# Patient Record
Sex: Female | Born: 1955
Health system: Southern US, Community
[De-identification: ages and names within clinical notes are randomized; demographics above are authoritative.]

---

## 2015-10-24 DIAGNOSIS — Z8249 Family history of ischemic heart disease and other diseases of the circulatory system: Secondary | ICD-10-CM | POA: Diagnosis not present

## 2015-10-24 DIAGNOSIS — Z0000 Encounter for general adult medical examination without abnormal findings: Secondary | ICD-10-CM | POA: Diagnosis not present

## 2015-10-24 DIAGNOSIS — F458 Other somatoform disorders: Secondary | ICD-10-CM | POA: Diagnosis not present

## 2015-10-24 DIAGNOSIS — G44009 Cluster headache syndrome, unspecified, not intractable: Secondary | ICD-10-CM | POA: Diagnosis not present

## 2015-10-24 DIAGNOSIS — R739 Hyperglycemia, unspecified: Secondary | ICD-10-CM | POA: Diagnosis not present

## 2015-10-24 DIAGNOSIS — E781 Pure hyperglyceridemia: Secondary | ICD-10-CM | POA: Diagnosis not present

## 2015-10-25 DIAGNOSIS — R739 Hyperglycemia, unspecified: Secondary | ICD-10-CM | POA: Diagnosis not present

## 2015-10-25 DIAGNOSIS — E781 Pure hyperglyceridemia: Secondary | ICD-10-CM | POA: Diagnosis not present

## 2015-11-22 DIAGNOSIS — Z6834 Body mass index (BMI) 34.0-34.9, adult: Secondary | ICD-10-CM | POA: Diagnosis not present

## 2015-11-22 DIAGNOSIS — E669 Obesity, unspecified: Secondary | ICD-10-CM | POA: Diagnosis not present

## 2015-11-22 DIAGNOSIS — Z01419 Encounter for gynecological examination (general) (routine) without abnormal findings: Secondary | ICD-10-CM | POA: Diagnosis not present

## 2015-11-22 DIAGNOSIS — Z8249 Family history of ischemic heart disease and other diseases of the circulatory system: Secondary | ICD-10-CM | POA: Diagnosis not present

## 2016-04-09 DIAGNOSIS — R42 Dizziness and giddiness: Secondary | ICD-10-CM | POA: Diagnosis not present

## 2016-04-09 DIAGNOSIS — R001 Bradycardia, unspecified: Secondary | ICD-10-CM | POA: Diagnosis not present

## 2016-04-12 DIAGNOSIS — R001 Bradycardia, unspecified: Secondary | ICD-10-CM | POA: Insufficient documentation

## 2016-04-23 ENCOUNTER — Ambulatory Visit (INDEPENDENT_AMBULATORY_CARE_PROVIDER_SITE_OTHER): Payer: 59

## 2016-04-23 ENCOUNTER — Encounter (INDEPENDENT_AMBULATORY_CARE_PROVIDER_SITE_OTHER): Payer: Self-pay

## 2016-04-23 DIAGNOSIS — Z1231 Encounter for screening mammogram for malignant neoplasm of breast: Secondary | ICD-10-CM | POA: Diagnosis not present

## 2016-04-23 DIAGNOSIS — R001 Bradycardia, unspecified: Secondary | ICD-10-CM

## 2016-04-23 DIAGNOSIS — Z803 Family history of malignant neoplasm of breast: Secondary | ICD-10-CM | POA: Diagnosis not present

## 2016-04-23 DIAGNOSIS — E781 Pure hyperglyceridemia: Secondary | ICD-10-CM | POA: Diagnosis not present

## 2016-04-23 DIAGNOSIS — Z0000 Encounter for general adult medical examination without abnormal findings: Secondary | ICD-10-CM | POA: Diagnosis not present

## 2016-05-08 ENCOUNTER — Other Ambulatory Visit: Payer: Self-pay | Admitting: Internal Medicine

## 2016-05-08 DIAGNOSIS — Z6834 Body mass index (BMI) 34.0-34.9, adult: Secondary | ICD-10-CM | POA: Diagnosis not present

## 2016-05-08 DIAGNOSIS — Z23 Encounter for immunization: Secondary | ICD-10-CM | POA: Diagnosis not present

## 2016-05-08 DIAGNOSIS — Z6833 Body mass index (BMI) 33.0-33.9, adult: Secondary | ICD-10-CM | POA: Diagnosis not present

## 2016-05-08 DIAGNOSIS — Z8249 Family history of ischemic heart disease and other diseases of the circulatory system: Secondary | ICD-10-CM

## 2016-05-08 DIAGNOSIS — R7301 Impaired fasting glucose: Secondary | ICD-10-CM | POA: Diagnosis not present

## 2016-05-08 DIAGNOSIS — E781 Pure hyperglyceridemia: Secondary | ICD-10-CM | POA: Diagnosis not present

## 2016-05-08 DIAGNOSIS — Z0000 Encounter for general adult medical examination without abnormal findings: Secondary | ICD-10-CM | POA: Diagnosis not present

## 2016-05-08 DIAGNOSIS — E669 Obesity, unspecified: Secondary | ICD-10-CM | POA: Diagnosis not present

## 2016-05-25 ENCOUNTER — Other Ambulatory Visit: Payer: 59

## 2016-05-31 ENCOUNTER — Ambulatory Visit
Admission: RE | Admit: 2016-05-31 | Discharge: 2016-05-31 | Disposition: A | Discharge status: Home / Self Care | Payer: 59 | Source: Ambulatory Visit | Attending: Internal Medicine | Admitting: Internal Medicine

## 2016-05-31 DIAGNOSIS — Z8249 Family history of ischemic heart disease and other diseases of the circulatory system: Secondary | ICD-10-CM | POA: Diagnosis not present

## 2016-07-04 DIAGNOSIS — L814 Other melanin hyperpigmentation: Secondary | ICD-10-CM | POA: Diagnosis not present

## 2016-07-04 DIAGNOSIS — L918 Other hypertrophic disorders of the skin: Secondary | ICD-10-CM | POA: Diagnosis not present

## 2016-07-04 DIAGNOSIS — L720 Epidermal cyst: Secondary | ICD-10-CM | POA: Diagnosis not present

## 2016-07-04 DIAGNOSIS — D2261 Melanocytic nevi of right upper limb, including shoulder: Secondary | ICD-10-CM | POA: Diagnosis not present

## 2016-07-04 DIAGNOSIS — D2272 Melanocytic nevi of left lower limb, including hip: Secondary | ICD-10-CM | POA: Diagnosis not present

## 2016-07-04 DIAGNOSIS — L821 Other seborrheic keratosis: Secondary | ICD-10-CM | POA: Diagnosis not present

## 2016-07-04 DIAGNOSIS — D171 Benign lipomatous neoplasm of skin and subcutaneous tissue of trunk: Secondary | ICD-10-CM | POA: Diagnosis not present

## 2016-07-04 DIAGNOSIS — L570 Actinic keratosis: Secondary | ICD-10-CM | POA: Diagnosis not present

## 2016-07-04 DIAGNOSIS — D485 Neoplasm of uncertain behavior of skin: Secondary | ICD-10-CM | POA: Diagnosis not present

## 2016-07-04 DIAGNOSIS — L820 Inflamed seborrheic keratosis: Secondary | ICD-10-CM | POA: Diagnosis not present

## 2016-07-04 DIAGNOSIS — D225 Melanocytic nevi of trunk: Secondary | ICD-10-CM | POA: Diagnosis not present

## 2016-07-19 DIAGNOSIS — Z23 Encounter for immunization: Secondary | ICD-10-CM | POA: Diagnosis not present

## 2016-07-24 DIAGNOSIS — D171 Benign lipomatous neoplasm of skin and subcutaneous tissue of trunk: Secondary | ICD-10-CM | POA: Diagnosis not present

## 2016-07-24 MED FILL — MUPIROCIN 2% OINTMENT: 2 | 7 days supply | Qty: 22 | Fill #0

## 2016-07-28 DIAGNOSIS — G44009 Cluster headache syndrome, unspecified, not intractable: Secondary | ICD-10-CM | POA: Diagnosis not present

## 2016-08-01 DIAGNOSIS — H5213 Myopia, bilateral: Secondary | ICD-10-CM | POA: Diagnosis not present

## 2016-08-07 DIAGNOSIS — Z4802 Encounter for removal of sutures: Secondary | ICD-10-CM | POA: Diagnosis not present

## 2016-08-28 DIAGNOSIS — G44009 Cluster headache syndrome, unspecified, not intractable: Secondary | ICD-10-CM | POA: Diagnosis not present

## 2016-09-27 DIAGNOSIS — G44009 Cluster headache syndrome, unspecified, not intractable: Secondary | ICD-10-CM | POA: Diagnosis not present

## 2016-10-28 DIAGNOSIS — G44009 Cluster headache syndrome, unspecified, not intractable: Secondary | ICD-10-CM | POA: Diagnosis not present

## 2016-11-22 DIAGNOSIS — Z01419 Encounter for gynecological examination (general) (routine) without abnormal findings: Secondary | ICD-10-CM | POA: Diagnosis not present

## 2016-11-22 DIAGNOSIS — S86112A Strain of other muscle(s) and tendon(s) of posterior muscle group at lower leg level, left leg, initial encounter: Secondary | ICD-10-CM | POA: Diagnosis not present

## 2016-11-22 DIAGNOSIS — R238 Other skin changes: Secondary | ICD-10-CM | POA: Diagnosis not present

## 2016-11-28 DIAGNOSIS — G44009 Cluster headache syndrome, unspecified, not intractable: Secondary | ICD-10-CM | POA: Diagnosis not present

## 2016-12-26 DIAGNOSIS — G44009 Cluster headache syndrome, unspecified, not intractable: Secondary | ICD-10-CM | POA: Diagnosis not present

## 2017-02-02 ENCOUNTER — Telehealth: Payer: 59 | Admitting: Nurse Practitioner

## 2017-02-02 DIAGNOSIS — J0100 Acute maxillary sinusitis, unspecified: Secondary | ICD-10-CM | POA: Diagnosis not present

## 2017-02-02 MED ORDER — AMOXICILLIN-POT CLAVULANATE 875-125 MG PO TABS: 1.0000 | ORAL_TABLET | Freq: Two times a day (BID) | ORAL | 0 refills | Status: AC

## 2017-02-02 NOTE — Progress Notes (Signed)

## 2017-02-06 DIAGNOSIS — D2339 Other benign neoplasm of skin of other parts of face: Secondary | ICD-10-CM | POA: Diagnosis not present

## 2017-02-06 DIAGNOSIS — B353 Tinea pedis: Secondary | ICD-10-CM | POA: Diagnosis not present

## 2017-02-06 DIAGNOSIS — I8393 Asymptomatic varicose veins of bilateral lower extremities: Secondary | ICD-10-CM | POA: Diagnosis not present

## 2017-02-06 DIAGNOSIS — L723 Sebaceous cyst: Secondary | ICD-10-CM | POA: Diagnosis not present

## 2017-02-06 DIAGNOSIS — D235 Other benign neoplasm of skin of trunk: Secondary | ICD-10-CM | POA: Diagnosis not present

## 2017-02-06 DIAGNOSIS — D1801 Hemangioma of skin and subcutaneous tissue: Secondary | ICD-10-CM | POA: Diagnosis not present

## 2017-02-06 DIAGNOSIS — L821 Other seborrheic keratosis: Secondary | ICD-10-CM | POA: Diagnosis not present

## 2017-03-30 IMAGING — US US ABDOMINAL AORTA SCREENING AAA
1 series · 9 of 9 positions shown · non-contrast
Comparison: No recent prior .

CLINICAL DATA: Family history of AAA.

EXAM:
ULTRASOUND OF ABDOMINAL AORTA
TECHNIQUE: Ultrasound examination of the abdominal aorta was performed to
evaluate for abdominal aortic aneurysm.

[Series 1: us abdominal aorta screening aaa · 0.32mm/px · 9 of 9 slices shown]
[im 1/9]
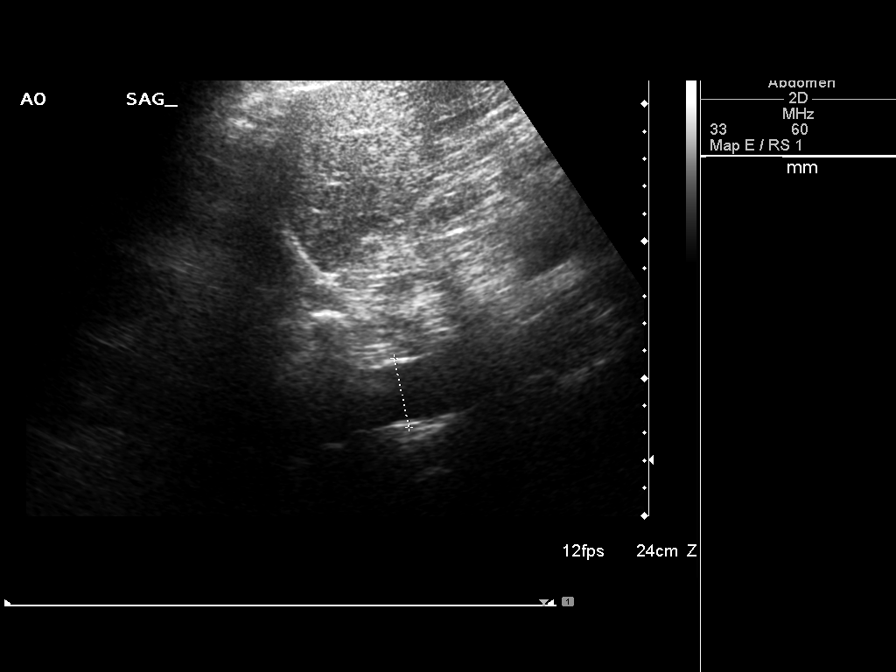
[im 2/9]
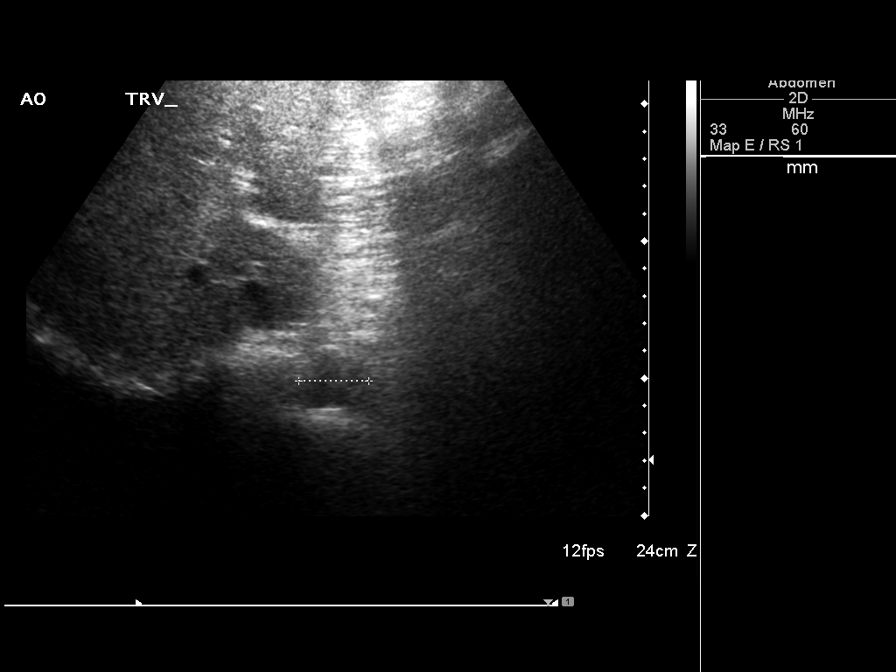
[im 3/9]
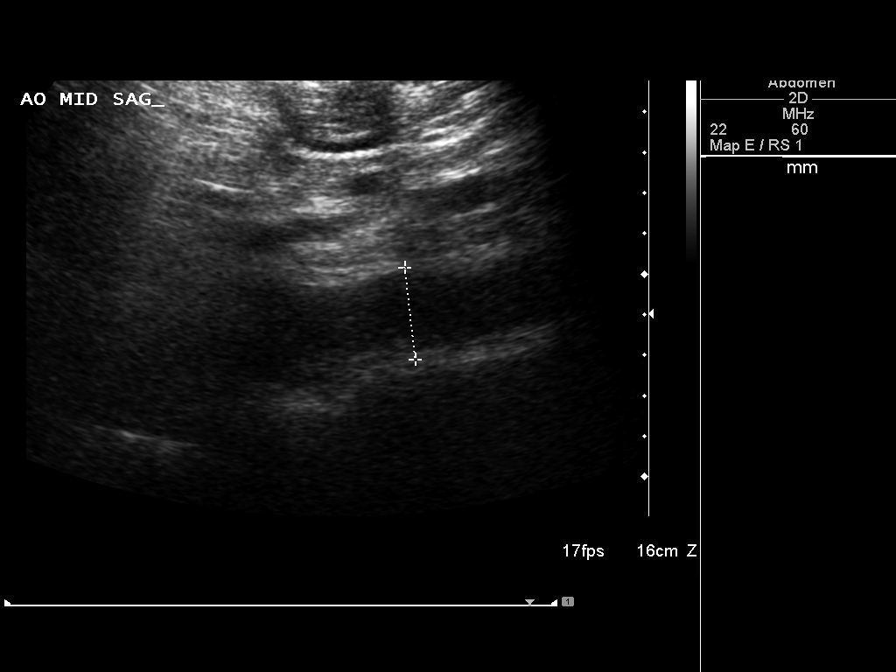
[im 4/9]
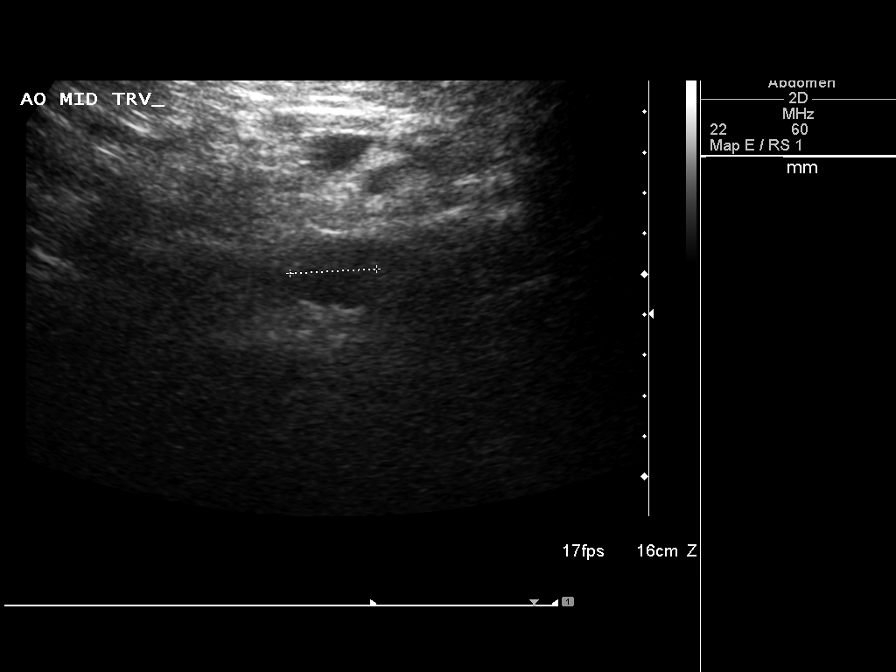
[im 5/9]
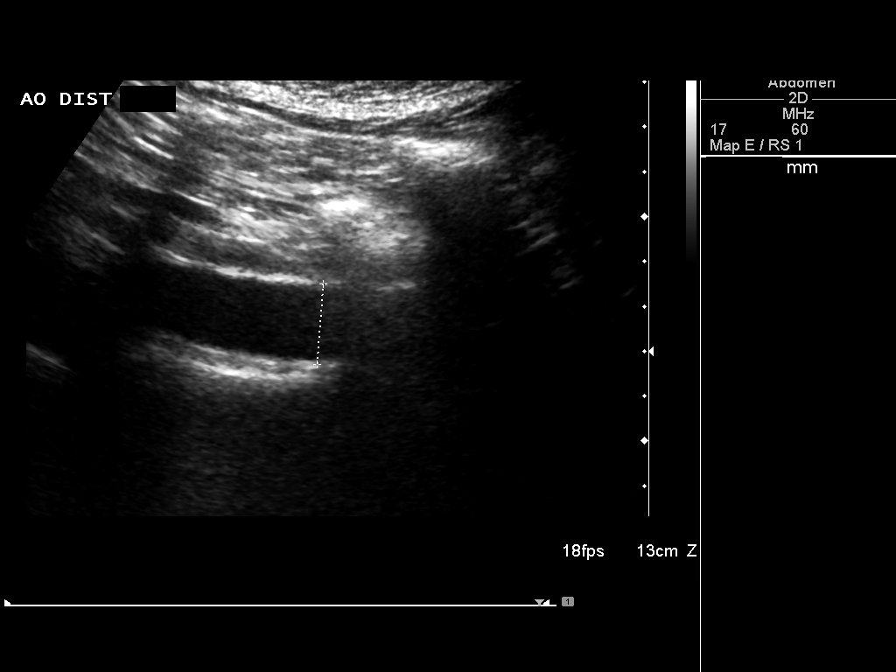
[im 6/9]
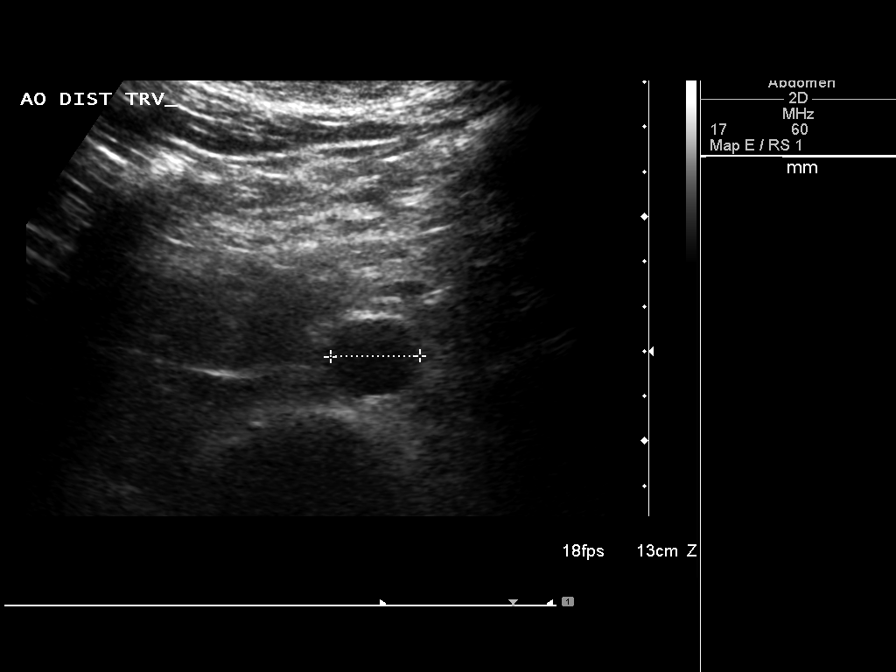
[im 7/9]
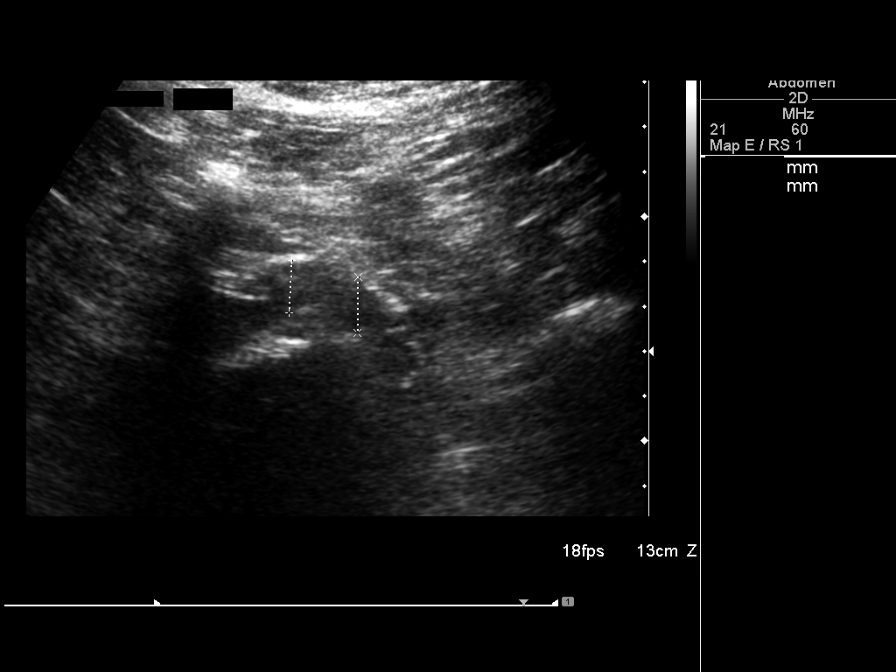
[im 8/9]
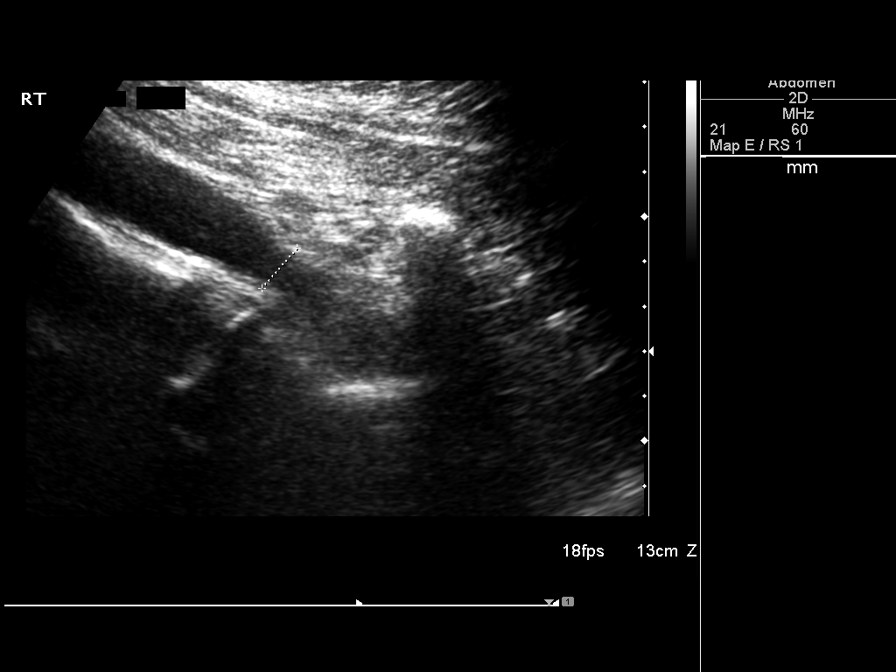
[im 9/9]
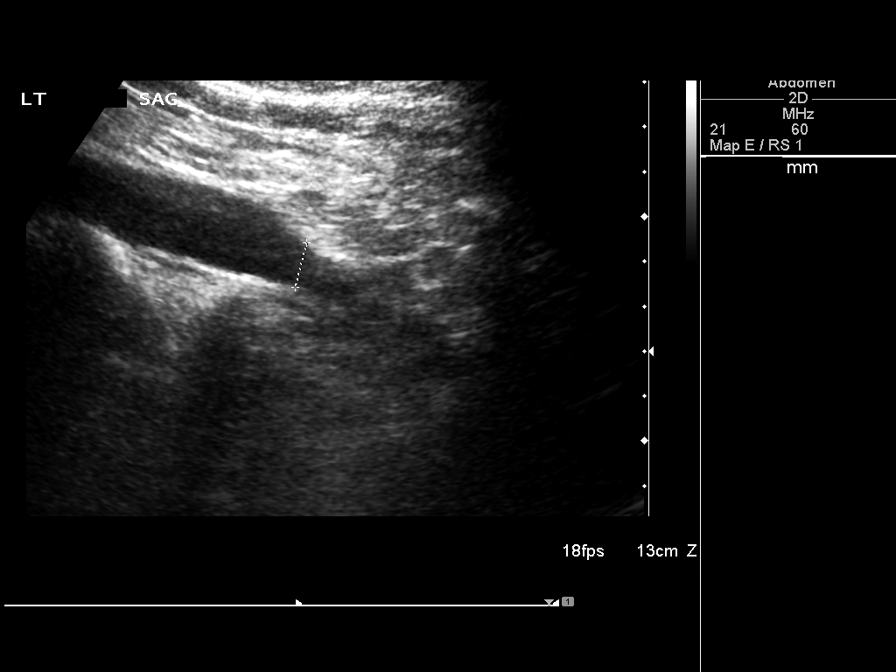

[9 of 9 positions shown; findings below may reference images not displayed]

FINDINGS: Abdominal Aorta

Mild ectasia 2.6 cm.  No evidence of aneurysm.

Maximum Diameter: 2.6 cm
IMPRESSION: Mild abdominal aortic ectasia 2.6 cm. No evidence of aneurysm.
Ectatic abdominal aorta at risk for aneurysm development. Recommend
followup by ultrasound in 5 years. This recommendation follows ACR
consensus guidelines: White Paper of the ACR Incidental Findings
Committee II on Vascular Findings. [HOSPITAL] [id number];

## 2017-04-19 DIAGNOSIS — D481 Neoplasm of uncertain behavior of connective and other soft tissue: Secondary | ICD-10-CM | POA: Diagnosis not present

## 2017-04-19 DIAGNOSIS — B351 Tinea unguium: Secondary | ICD-10-CM | POA: Diagnosis not present

## 2017-05-06 DIAGNOSIS — Z1231 Encounter for screening mammogram for malignant neoplasm of breast: Secondary | ICD-10-CM | POA: Diagnosis not present

## 2017-05-08 DIAGNOSIS — R7301 Impaired fasting glucose: Secondary | ICD-10-CM | POA: Diagnosis not present

## 2017-05-08 DIAGNOSIS — E781 Pure hyperglyceridemia: Secondary | ICD-10-CM | POA: Diagnosis not present

## 2017-05-09 DIAGNOSIS — Z6835 Body mass index (BMI) 35.0-35.9, adult: Secondary | ICD-10-CM | POA: Diagnosis not present

## 2017-05-09 DIAGNOSIS — E782 Mixed hyperlipidemia: Secondary | ICD-10-CM | POA: Diagnosis not present

## 2017-05-09 DIAGNOSIS — R7301 Impaired fasting glucose: Secondary | ICD-10-CM | POA: Diagnosis not present

## 2017-05-09 DIAGNOSIS — E669 Obesity, unspecified: Secondary | ICD-10-CM | POA: Diagnosis not present

## 2017-05-09 DIAGNOSIS — Z0000 Encounter for general adult medical examination without abnormal findings: Secondary | ICD-10-CM | POA: Diagnosis not present

## 2017-06-10 DIAGNOSIS — B353 Tinea pedis: Secondary | ICD-10-CM | POA: Diagnosis not present

## 2017-06-10 DIAGNOSIS — L821 Other seborrheic keratosis: Secondary | ICD-10-CM | POA: Diagnosis not present

## 2017-08-07 DIAGNOSIS — H5213 Myopia, bilateral: Secondary | ICD-10-CM | POA: Diagnosis not present

## 2017-11-04 DIAGNOSIS — L820 Inflamed seborrheic keratosis: Secondary | ICD-10-CM | POA: Diagnosis not present

## 2017-11-04 DIAGNOSIS — D485 Neoplasm of uncertain behavior of skin: Secondary | ICD-10-CM | POA: Diagnosis not present

## 2017-11-04 DIAGNOSIS — L821 Other seborrheic keratosis: Secondary | ICD-10-CM | POA: Diagnosis not present

## 2017-11-04 DIAGNOSIS — L810 Postinflammatory hyperpigmentation: Secondary | ICD-10-CM | POA: Diagnosis not present

## 2017-11-26 DIAGNOSIS — N941 Unspecified dyspareunia: Secondary | ICD-10-CM | POA: Diagnosis not present

## 2017-11-26 DIAGNOSIS — Z01411 Encounter for gynecological examination (general) (routine) with abnormal findings: Secondary | ICD-10-CM | POA: Diagnosis not present

## 2017-11-26 DIAGNOSIS — N952 Postmenopausal atrophic vaginitis: Secondary | ICD-10-CM | POA: Diagnosis not present

## 2017-11-26 MED FILL — IMVEXXY MAINTENANCE PACK 4: 4 | 28 days supply | Qty: 8 | Fill #0

## 2018-02-03 DIAGNOSIS — L820 Inflamed seborrheic keratosis: Secondary | ICD-10-CM | POA: Diagnosis not present

## 2018-02-03 DIAGNOSIS — L821 Other seborrheic keratosis: Secondary | ICD-10-CM | POA: Diagnosis not present

## 2018-02-03 DIAGNOSIS — L570 Actinic keratosis: Secondary | ICD-10-CM | POA: Diagnosis not present

## 2018-02-03 DIAGNOSIS — D1801 Hemangioma of skin and subcutaneous tissue: Secondary | ICD-10-CM | POA: Diagnosis not present

## 2018-03-18 DIAGNOSIS — G44009 Cluster headache syndrome, unspecified, not intractable: Secondary | ICD-10-CM | POA: Diagnosis not present

## 2018-04-14 MED FILL — VERAPAMIL HCL 40 MG TABS: 40 | 30 days supply | Qty: 90 | Fill #0

## 2018-04-14 MED FILL — predniSONE 10 MG TABS: 10 | 18 days supply | Qty: 64 | Fill #0

## 2018-05-05 DIAGNOSIS — Z8249 Family history of ischemic heart disease and other diseases of the circulatory system: Secondary | ICD-10-CM | POA: Diagnosis not present

## 2018-05-05 DIAGNOSIS — Z0000 Encounter for general adult medical examination without abnormal findings: Secondary | ICD-10-CM | POA: Diagnosis not present

## 2018-05-05 DIAGNOSIS — E669 Obesity, unspecified: Secondary | ICD-10-CM | POA: Diagnosis not present

## 2018-05-05 DIAGNOSIS — Z6833 Body mass index (BMI) 33.0-33.9, adult: Secondary | ICD-10-CM | POA: Diagnosis not present

## 2018-05-05 DIAGNOSIS — R7301 Impaired fasting glucose: Secondary | ICD-10-CM | POA: Diagnosis not present

## 2018-05-05 DIAGNOSIS — G44009 Cluster headache syndrome, unspecified, not intractable: Secondary | ICD-10-CM | POA: Diagnosis not present

## 2018-05-05 DIAGNOSIS — E782 Mixed hyperlipidemia: Secondary | ICD-10-CM | POA: Diagnosis not present

## 2018-05-07 DIAGNOSIS — Z1231 Encounter for screening mammogram for malignant neoplasm of breast: Secondary | ICD-10-CM | POA: Diagnosis not present

## 2018-05-07 MED FILL — ATORVASTATIN CALCIUM 20 MG: 20 | 90 days supply | Qty: 90 | Fill #0

## 2018-05-08 MED FILL — VERAPAMIL HCL 40 MG TABS: 40 | 30 days supply | Qty: 90 | Fill #1

## 2018-06-09 DIAGNOSIS — Z1211 Encounter for screening for malignant neoplasm of colon: Secondary | ICD-10-CM | POA: Diagnosis not present

## 2018-06-10 MED FILL — ROSUVASTATIN CALCIUM 5 MG T: 5 | 28 days supply | Qty: 12 | Fill #0

## 2018-07-03 DIAGNOSIS — K5730 Diverticulosis of large intestine without perforation or abscess without bleeding: Secondary | ICD-10-CM | POA: Diagnosis not present

## 2018-07-03 DIAGNOSIS — D125 Benign neoplasm of sigmoid colon: Secondary | ICD-10-CM | POA: Diagnosis not present

## 2018-07-03 DIAGNOSIS — Z1211 Encounter for screening for malignant neoplasm of colon: Secondary | ICD-10-CM | POA: Diagnosis not present

## 2018-07-03 DIAGNOSIS — D123 Benign neoplasm of transverse colon: Secondary | ICD-10-CM | POA: Diagnosis not present

## 2018-07-07 DIAGNOSIS — H5213 Myopia, bilateral: Secondary | ICD-10-CM | POA: Diagnosis not present

## 2018-07-07 DIAGNOSIS — Z5181 Encounter for therapeutic drug level monitoring: Secondary | ICD-10-CM | POA: Diagnosis not present

## 2018-07-07 DIAGNOSIS — E785 Hyperlipidemia, unspecified: Secondary | ICD-10-CM | POA: Diagnosis not present

## 2018-07-07 MED FILL — ROSUVASTATIN CALCIUM 5 MG T: 5 | 84 days supply | Qty: 36 | Fill #1

## 2018-07-09 MED FILL — VERAPAMIL HCL 40 MG TABS: 40 | 90 days supply | Qty: 360 | Fill #0

## 2018-08-04 DIAGNOSIS — L723 Sebaceous cyst: Secondary | ICD-10-CM | POA: Diagnosis not present

## 2018-08-04 DIAGNOSIS — D2339 Other benign neoplasm of skin of other parts of face: Secondary | ICD-10-CM | POA: Diagnosis not present

## 2018-08-04 DIAGNOSIS — L853 Xerosis cutis: Secondary | ICD-10-CM | POA: Diagnosis not present

## 2018-08-04 DIAGNOSIS — B351 Tinea unguium: Secondary | ICD-10-CM | POA: Diagnosis not present

## 2018-08-04 DIAGNOSIS — L821 Other seborrheic keratosis: Secondary | ICD-10-CM | POA: Diagnosis not present

## 2018-08-04 DIAGNOSIS — D1801 Hemangioma of skin and subcutaneous tissue: Secondary | ICD-10-CM | POA: Diagnosis not present

## 2018-10-27 MED FILL — VERAPAMIL HCL 40 MG TABS: 40 | 90 days supply | Qty: 360 | Fill #0

## 2019-05-25 DIAGNOSIS — E782 Mixed hyperlipidemia: Secondary | ICD-10-CM | POA: Diagnosis not present

## 2019-05-25 DIAGNOSIS — Z803 Family history of malignant neoplasm of breast: Secondary | ICD-10-CM | POA: Diagnosis not present

## 2019-05-25 DIAGNOSIS — Z1231 Encounter for screening mammogram for malignant neoplasm of breast: Secondary | ICD-10-CM | POA: Diagnosis not present

## 2019-05-25 DIAGNOSIS — H5213 Myopia, bilateral: Secondary | ICD-10-CM | POA: Diagnosis not present

## 2019-05-25 DIAGNOSIS — R7301 Impaired fasting glucose: Secondary | ICD-10-CM | POA: Diagnosis not present

## 2019-05-25 MED FILL — VERAPAMIL HCL 40 MG TABS: 40 | 90 days supply | Qty: 360 | Fill #1

## 2019-05-26 DIAGNOSIS — E669 Obesity, unspecified: Secondary | ICD-10-CM | POA: Diagnosis not present

## 2019-05-26 DIAGNOSIS — R7301 Impaired fasting glucose: Secondary | ICD-10-CM | POA: Diagnosis not present

## 2019-05-26 DIAGNOSIS — Z0000 Encounter for general adult medical examination without abnormal findings: Secondary | ICD-10-CM | POA: Diagnosis not present

## 2019-05-26 DIAGNOSIS — Z8249 Family history of ischemic heart disease and other diseases of the circulatory system: Secondary | ICD-10-CM | POA: Diagnosis not present

## 2019-05-26 DIAGNOSIS — E782 Mixed hyperlipidemia: Secondary | ICD-10-CM | POA: Diagnosis not present

## 2019-05-26 DIAGNOSIS — G44009 Cluster headache syndrome, unspecified, not intractable: Secondary | ICD-10-CM | POA: Diagnosis not present

## 2019-05-26 MED FILL — PRAVASTATIN NA 10 MG TAB: 10 | 90 days supply | Qty: 90 | Fill #0

## 2019-07-21 DIAGNOSIS — R7301 Impaired fasting glucose: Secondary | ICD-10-CM | POA: Diagnosis not present

## 2019-07-21 DIAGNOSIS — E782 Mixed hyperlipidemia: Secondary | ICD-10-CM | POA: Diagnosis not present

## 2019-07-23 MED FILL — PRAVASTATIN NA 10 MG TAB: 10 | 30 days supply | Qty: 60 | Fill #0

## 2019-08-03 DIAGNOSIS — L723 Sebaceous cyst: Secondary | ICD-10-CM | POA: Diagnosis not present

## 2019-08-03 DIAGNOSIS — B351 Tinea unguium: Secondary | ICD-10-CM | POA: Diagnosis not present

## 2019-08-03 DIAGNOSIS — L820 Inflamed seborrheic keratosis: Secondary | ICD-10-CM | POA: Diagnosis not present

## 2019-08-03 DIAGNOSIS — I788 Other diseases of capillaries: Secondary | ICD-10-CM | POA: Diagnosis not present

## 2019-08-03 DIAGNOSIS — D1801 Hemangioma of skin and subcutaneous tissue: Secondary | ICD-10-CM | POA: Diagnosis not present

## 2019-08-03 DIAGNOSIS — L810 Postinflammatory hyperpigmentation: Secondary | ICD-10-CM | POA: Diagnosis not present

## 2019-08-03 DIAGNOSIS — L814 Other melanin hyperpigmentation: Secondary | ICD-10-CM | POA: Diagnosis not present

## 2019-08-03 DIAGNOSIS — L821 Other seborrheic keratosis: Secondary | ICD-10-CM | POA: Diagnosis not present

## 2019-09-08 DIAGNOSIS — J019 Acute sinusitis, unspecified: Secondary | ICD-10-CM | POA: Diagnosis not present

## 2019-09-08 DIAGNOSIS — Z20828 Contact with and (suspected) exposure to other viral communicable diseases: Secondary | ICD-10-CM | POA: Diagnosis not present

## 2019-09-08 DIAGNOSIS — R519 Headache, unspecified: Secondary | ICD-10-CM | POA: Diagnosis not present

## 2019-09-08 DIAGNOSIS — R0600 Dyspnea, unspecified: Secondary | ICD-10-CM | POA: Diagnosis not present

## 2019-10-15 DIAGNOSIS — G44009 Cluster headache syndrome, unspecified, not intractable: Secondary | ICD-10-CM | POA: Diagnosis not present

## 2019-10-15 DIAGNOSIS — J302 Other seasonal allergic rhinitis: Secondary | ICD-10-CM | POA: Diagnosis not present

## 2019-10-15 DIAGNOSIS — E785 Hyperlipidemia, unspecified: Secondary | ICD-10-CM | POA: Diagnosis not present

## 2019-10-26 DIAGNOSIS — J321 Chronic frontal sinusitis: Secondary | ICD-10-CM | POA: Diagnosis not present

## 2019-10-26 DIAGNOSIS — J3089 Other allergic rhinitis: Secondary | ICD-10-CM | POA: Diagnosis not present

## 2019-10-29 DIAGNOSIS — Z0000 Encounter for general adult medical examination without abnormal findings: Secondary | ICD-10-CM | POA: Diagnosis not present

## 2019-10-29 DIAGNOSIS — E785 Hyperlipidemia, unspecified: Secondary | ICD-10-CM | POA: Diagnosis not present

## 2019-10-29 DIAGNOSIS — N959 Unspecified menopausal and perimenopausal disorder: Secondary | ICD-10-CM | POA: Diagnosis not present

## 2019-11-04 DIAGNOSIS — Z01419 Encounter for gynecological examination (general) (routine) without abnormal findings: Secondary | ICD-10-CM | POA: Diagnosis not present

## 2019-11-04 DIAGNOSIS — E785 Hyperlipidemia, unspecified: Secondary | ICD-10-CM | POA: Diagnosis not present

## 2019-11-04 DIAGNOSIS — R739 Hyperglycemia, unspecified: Secondary | ICD-10-CM | POA: Diagnosis not present

## 2019-11-05 DIAGNOSIS — Z01419 Encounter for gynecological examination (general) (routine) without abnormal findings: Secondary | ICD-10-CM | POA: Diagnosis not present

## 2019-11-05 DIAGNOSIS — J343 Hypertrophy of nasal turbinates: Secondary | ICD-10-CM | POA: Diagnosis not present

## 2019-11-05 DIAGNOSIS — J301 Allergic rhinitis due to pollen: Secondary | ICD-10-CM | POA: Diagnosis not present

## 2019-11-05 DIAGNOSIS — J349 Unspecified disorder of nose and nasal sinuses: Secondary | ICD-10-CM | POA: Diagnosis not present

## 2019-11-05 DIAGNOSIS — J342 Deviated nasal septum: Secondary | ICD-10-CM | POA: Diagnosis not present

## 2019-11-05 DIAGNOSIS — J309 Allergic rhinitis, unspecified: Secondary | ICD-10-CM | POA: Diagnosis not present

## 2019-11-05 DIAGNOSIS — J322 Chronic ethmoidal sinusitis: Secondary | ICD-10-CM | POA: Diagnosis not present

## 2019-11-23 DIAGNOSIS — M791 Myalgia, unspecified site: Secondary | ICD-10-CM | POA: Diagnosis not present

## 2019-11-24 DIAGNOSIS — J302 Other seasonal allergic rhinitis: Secondary | ICD-10-CM | POA: Diagnosis not present

## 2019-11-24 DIAGNOSIS — J322 Chronic ethmoidal sinusitis: Secondary | ICD-10-CM | POA: Diagnosis not present

## 2019-12-08 DIAGNOSIS — M79605 Pain in left leg: Secondary | ICD-10-CM | POA: Diagnosis not present

## 2019-12-08 DIAGNOSIS — Z8249 Family history of ischemic heart disease and other diseases of the circulatory system: Secondary | ICD-10-CM | POA: Diagnosis not present

## 2020-02-03 DIAGNOSIS — R739 Hyperglycemia, unspecified: Secondary | ICD-10-CM | POA: Diagnosis not present

## 2020-02-03 DIAGNOSIS — E785 Hyperlipidemia, unspecified: Secondary | ICD-10-CM | POA: Diagnosis not present

## 2020-02-05 DIAGNOSIS — Z780 Asymptomatic menopausal state: Secondary | ICD-10-CM | POA: Diagnosis not present

## 2020-02-05 DIAGNOSIS — M85852 Other specified disorders of bone density and structure, left thigh: Secondary | ICD-10-CM | POA: Diagnosis not present

## 2020-02-05 DIAGNOSIS — N959 Unspecified menopausal and perimenopausal disorder: Secondary | ICD-10-CM | POA: Diagnosis not present

## 2020-02-15 DIAGNOSIS — E785 Hyperlipidemia, unspecified: Secondary | ICD-10-CM | POA: Diagnosis not present

## 2020-02-15 DIAGNOSIS — M7661 Achilles tendinitis, right leg: Secondary | ICD-10-CM | POA: Diagnosis not present

## 2020-02-18 DIAGNOSIS — L820 Inflamed seborrheic keratosis: Secondary | ICD-10-CM | POA: Diagnosis not present

## 2020-02-18 DIAGNOSIS — L821 Other seborrheic keratosis: Secondary | ICD-10-CM | POA: Diagnosis not present

## 2020-03-21 DIAGNOSIS — W57XXXA Bitten or stung by nonvenomous insect and other nonvenomous arthropods, initial encounter: Secondary | ICD-10-CM | POA: Diagnosis not present

## 2020-03-21 DIAGNOSIS — L821 Other seborrheic keratosis: Secondary | ICD-10-CM | POA: Diagnosis not present

## 2020-07-08 DIAGNOSIS — E785 Hyperlipidemia, unspecified: Secondary | ICD-10-CM | POA: Diagnosis not present

## 2020-08-04 DIAGNOSIS — R921 Mammographic calcification found on diagnostic imaging of breast: Secondary | ICD-10-CM | POA: Diagnosis not present

## 2020-08-04 DIAGNOSIS — Z1231 Encounter for screening mammogram for malignant neoplasm of breast: Secondary | ICD-10-CM | POA: Diagnosis not present

## 2020-08-08 DIAGNOSIS — C44519 Basal cell carcinoma of skin of other part of trunk: Secondary | ICD-10-CM | POA: Diagnosis not present

## 2020-08-08 DIAGNOSIS — D1801 Hemangioma of skin and subcutaneous tissue: Secondary | ICD-10-CM | POA: Diagnosis not present

## 2020-08-08 DIAGNOSIS — D485 Neoplasm of uncertain behavior of skin: Secondary | ICD-10-CM | POA: Diagnosis not present

## 2020-08-08 DIAGNOSIS — L821 Other seborrheic keratosis: Secondary | ICD-10-CM | POA: Diagnosis not present

## 2020-08-08 DIAGNOSIS — L578 Other skin changes due to chronic exposure to nonionizing radiation: Secondary | ICD-10-CM | POA: Diagnosis not present

## 2020-08-08 DIAGNOSIS — L720 Epidermal cyst: Secondary | ICD-10-CM | POA: Diagnosis not present

## 2020-08-08 DIAGNOSIS — L853 Xerosis cutis: Secondary | ICD-10-CM | POA: Diagnosis not present

## 2020-08-08 DIAGNOSIS — L738 Other specified follicular disorders: Secondary | ICD-10-CM | POA: Diagnosis not present

## 2020-08-08 DIAGNOSIS — L728 Other follicular cysts of the skin and subcutaneous tissue: Secondary | ICD-10-CM | POA: Diagnosis not present

## 2020-08-08 DIAGNOSIS — L570 Actinic keratosis: Secondary | ICD-10-CM | POA: Diagnosis not present

## 2020-08-08 DIAGNOSIS — L814 Other melanin hyperpigmentation: Secondary | ICD-10-CM | POA: Diagnosis not present

## 2020-08-12 DIAGNOSIS — C44519 Basal cell carcinoma of skin of other part of trunk: Secondary | ICD-10-CM | POA: Diagnosis not present

## 2020-08-16 DIAGNOSIS — L720 Epidermal cyst: Secondary | ICD-10-CM | POA: Diagnosis not present

## 2020-08-31 DIAGNOSIS — G44029 Chronic cluster headache, not intractable: Secondary | ICD-10-CM | POA: Diagnosis not present

## 2020-08-31 DIAGNOSIS — Z0001 Encounter for general adult medical examination with abnormal findings: Secondary | ICD-10-CM | POA: Diagnosis not present

## 2020-08-31 DIAGNOSIS — E785 Hyperlipidemia, unspecified: Secondary | ICD-10-CM | POA: Diagnosis not present

## 2020-08-31 DIAGNOSIS — Z1331 Encounter for screening for depression: Secondary | ICD-10-CM | POA: Diagnosis not present

## 2020-09-07 DIAGNOSIS — L720 Epidermal cyst: Secondary | ICD-10-CM | POA: Diagnosis not present

## 2020-09-11 DIAGNOSIS — Z20822 Contact with and (suspected) exposure to covid-19: Secondary | ICD-10-CM | POA: Diagnosis not present

## 2020-09-16 DIAGNOSIS — I672 Cerebral atherosclerosis: Secondary | ICD-10-CM | POA: Diagnosis not present

## 2020-09-16 DIAGNOSIS — R519 Headache, unspecified: Secondary | ICD-10-CM | POA: Diagnosis not present

## 2020-09-19 DIAGNOSIS — Z09 Encounter for follow-up examination after completed treatment for conditions other than malignant neoplasm: Secondary | ICD-10-CM | POA: Diagnosis not present

## 2020-09-20 DIAGNOSIS — H2513 Age-related nuclear cataract, bilateral: Secondary | ICD-10-CM | POA: Diagnosis not present

## 2020-09-20 DIAGNOSIS — G43109 Migraine with aura, not intractable, without status migrainosus: Secondary | ICD-10-CM | POA: Diagnosis not present

## 2020-09-20 DIAGNOSIS — H43393 Other vitreous opacities, bilateral: Secondary | ICD-10-CM | POA: Diagnosis not present

## 2020-09-28 DIAGNOSIS — G44009 Cluster headache syndrome, unspecified, not intractable: Secondary | ICD-10-CM | POA: Diagnosis not present

## 2020-09-28 DIAGNOSIS — Z1389 Encounter for screening for other disorder: Secondary | ICD-10-CM | POA: Diagnosis not present

## 2020-09-28 DIAGNOSIS — Z9229 Personal history of other drug therapy: Secondary | ICD-10-CM | POA: Diagnosis not present

## 2020-09-28 DIAGNOSIS — Z6835 Body mass index (BMI) 35.0-35.9, adult: Secondary | ICD-10-CM | POA: Diagnosis not present

## 2020-09-28 DIAGNOSIS — G4750 Parasomnia, unspecified: Secondary | ICD-10-CM | POA: Diagnosis not present

## 2020-10-26 DIAGNOSIS — G4750 Parasomnia, unspecified: Secondary | ICD-10-CM | POA: Diagnosis not present

## 2020-11-09 DIAGNOSIS — C44529 Squamous cell carcinoma of skin of other part of trunk: Secondary | ICD-10-CM | POA: Diagnosis not present

## 2020-11-09 DIAGNOSIS — C44519 Basal cell carcinoma of skin of other part of trunk: Secondary | ICD-10-CM | POA: Diagnosis not present

## 2020-11-14 DIAGNOSIS — Z6834 Body mass index (BMI) 34.0-34.9, adult: Secondary | ICD-10-CM | POA: Diagnosis not present

## 2020-11-14 DIAGNOSIS — Z87891 Personal history of nicotine dependence: Secondary | ICD-10-CM | POA: Diagnosis not present

## 2020-11-14 DIAGNOSIS — G44009 Cluster headache syndrome, unspecified, not intractable: Secondary | ICD-10-CM | POA: Diagnosis not present

## 2020-11-14 DIAGNOSIS — G4750 Parasomnia, unspecified: Secondary | ICD-10-CM | POA: Diagnosis not present

## 2020-11-14 DIAGNOSIS — C44529 Squamous cell carcinoma of skin of other part of trunk: Secondary | ICD-10-CM | POA: Diagnosis not present

## 2020-11-14 DIAGNOSIS — Z9229 Personal history of other drug therapy: Secondary | ICD-10-CM | POA: Diagnosis not present

## 2020-11-14 DIAGNOSIS — G4733 Obstructive sleep apnea (adult) (pediatric): Secondary | ICD-10-CM | POA: Diagnosis not present

## 2021-03-22 DIAGNOSIS — L821 Other seborrheic keratosis: Secondary | ICD-10-CM | POA: Diagnosis not present

## 2021-03-22 DIAGNOSIS — L570 Actinic keratosis: Secondary | ICD-10-CM | POA: Diagnosis not present

## 2021-03-22 DIAGNOSIS — D485 Neoplasm of uncertain behavior of skin: Secondary | ICD-10-CM | POA: Diagnosis not present

## 2021-03-23 DIAGNOSIS — L570 Actinic keratosis: Secondary | ICD-10-CM | POA: Diagnosis not present

## 2021-06-12 DIAGNOSIS — Z23 Encounter for immunization: Secondary | ICD-10-CM | POA: Diagnosis not present

## 2021-06-12 DIAGNOSIS — G44009 Cluster headache syndrome, unspecified, not intractable: Secondary | ICD-10-CM | POA: Diagnosis not present

## 2021-06-12 DIAGNOSIS — Z8249 Family history of ischemic heart disease and other diseases of the circulatory system: Secondary | ICD-10-CM | POA: Diagnosis not present

## 2021-06-12 DIAGNOSIS — R739 Hyperglycemia, unspecified: Secondary | ICD-10-CM | POA: Diagnosis not present

## 2021-06-12 DIAGNOSIS — E785 Hyperlipidemia, unspecified: Secondary | ICD-10-CM | POA: Diagnosis not present

## 2021-07-24 DIAGNOSIS — Z86010 Personal history of colonic polyps: Secondary | ICD-10-CM | POA: Diagnosis not present

## 2021-07-24 DIAGNOSIS — K5730 Diverticulosis of large intestine without perforation or abscess without bleeding: Secondary | ICD-10-CM | POA: Diagnosis not present

## 2021-07-24 DIAGNOSIS — K5909 Other constipation: Secondary | ICD-10-CM | POA: Diagnosis not present

## 2021-08-07 DIAGNOSIS — L578 Other skin changes due to chronic exposure to nonionizing radiation: Secondary | ICD-10-CM | POA: Diagnosis not present

## 2021-08-07 DIAGNOSIS — Z803 Family history of malignant neoplasm of breast: Secondary | ICD-10-CM | POA: Diagnosis not present

## 2021-08-07 DIAGNOSIS — Z85828 Personal history of other malignant neoplasm of skin: Secondary | ICD-10-CM | POA: Diagnosis not present

## 2021-08-07 DIAGNOSIS — L821 Other seborrheic keratosis: Secondary | ICD-10-CM | POA: Diagnosis not present

## 2021-08-07 DIAGNOSIS — L738 Other specified follicular disorders: Secondary | ICD-10-CM | POA: Diagnosis not present

## 2021-08-07 DIAGNOSIS — L0109 Other impetigo: Secondary | ICD-10-CM | POA: Diagnosis not present

## 2021-08-07 DIAGNOSIS — D1801 Hemangioma of skin and subcutaneous tissue: Secondary | ICD-10-CM | POA: Diagnosis not present

## 2021-08-07 DIAGNOSIS — Z1231 Encounter for screening mammogram for malignant neoplasm of breast: Secondary | ICD-10-CM | POA: Diagnosis not present

## 2021-08-07 DIAGNOSIS — B351 Tinea unguium: Secondary | ICD-10-CM | POA: Diagnosis not present

## 2021-08-07 DIAGNOSIS — I8393 Asymptomatic varicose veins of bilateral lower extremities: Secondary | ICD-10-CM | POA: Diagnosis not present

## 2021-08-07 DIAGNOSIS — I788 Other diseases of capillaries: Secondary | ICD-10-CM | POA: Diagnosis not present

## 2021-08-22 DIAGNOSIS — Z136 Encounter for screening for cardiovascular disorders: Secondary | ICD-10-CM | POA: Diagnosis not present

## 2021-08-22 DIAGNOSIS — Z8249 Family history of ischemic heart disease and other diseases of the circulatory system: Secondary | ICD-10-CM | POA: Diagnosis not present

## 2021-08-27 DIAGNOSIS — Z20822 Contact with and (suspected) exposure to covid-19: Secondary | ICD-10-CM | POA: Diagnosis not present

## 2021-08-27 DIAGNOSIS — U071 COVID-19: Secondary | ICD-10-CM | POA: Diagnosis not present

## 2021-09-11 DIAGNOSIS — Z86010 Personal history of colonic polyps: Secondary | ICD-10-CM | POA: Diagnosis not present

## 2021-09-11 DIAGNOSIS — D123 Benign neoplasm of transverse colon: Secondary | ICD-10-CM | POA: Diagnosis not present

## 2021-09-14 DIAGNOSIS — Z0000 Encounter for general adult medical examination without abnormal findings: Secondary | ICD-10-CM | POA: Diagnosis not present

## 2021-09-17 DIAGNOSIS — J029 Acute pharyngitis, unspecified: Secondary | ICD-10-CM | POA: Diagnosis not present

## 2021-11-30 DIAGNOSIS — Z0000 Encounter for general adult medical examination without abnormal findings: Secondary | ICD-10-CM | POA: Diagnosis not present

## 2021-12-26 DIAGNOSIS — H5310 Unspecified subjective visual disturbances: Secondary | ICD-10-CM | POA: Diagnosis not present

## 2022-01-04 DIAGNOSIS — H3582 Retinal ischemia: Secondary | ICD-10-CM | POA: Diagnosis not present

## 2022-01-04 DIAGNOSIS — H3412 Central retinal artery occlusion, left eye: Secondary | ICD-10-CM | POA: Diagnosis not present

## 2022-01-04 DIAGNOSIS — E785 Hyperlipidemia, unspecified: Secondary | ICD-10-CM | POA: Diagnosis not present

## 2022-01-04 DIAGNOSIS — H43392 Other vitreous opacities, left eye: Secondary | ICD-10-CM | POA: Diagnosis not present

## 2022-01-04 DIAGNOSIS — R7303 Prediabetes: Secondary | ICD-10-CM | POA: Diagnosis not present

## 2022-01-04 DIAGNOSIS — Z8249 Family history of ischemic heart disease and other diseases of the circulatory system: Secondary | ICD-10-CM | POA: Diagnosis not present

## 2022-01-04 DIAGNOSIS — G44029 Chronic cluster headache, not intractable: Secondary | ICD-10-CM | POA: Diagnosis not present

## 2022-01-04 DIAGNOSIS — H353231 Exudative age-related macular degeneration, bilateral, with active choroidal neovascularization: Secondary | ICD-10-CM | POA: Diagnosis not present

## 2022-01-04 DIAGNOSIS — Z7985 Long-term (current) use of injectable non-insulin antidiabetic drugs: Secondary | ICD-10-CM | POA: Diagnosis not present

## 2022-01-15 ENCOUNTER — Other Ambulatory Visit (HOSPITAL_COMMUNITY): Payer: Self-pay

## 2022-02-02 DIAGNOSIS — M316 Other giant cell arteritis: Secondary | ICD-10-CM | POA: Diagnosis not present

## 2022-02-16 DIAGNOSIS — Z85828 Personal history of other malignant neoplasm of skin: Secondary | ICD-10-CM | POA: Diagnosis not present

## 2022-02-16 DIAGNOSIS — L821 Other seborrheic keratosis: Secondary | ICD-10-CM | POA: Diagnosis not present

## 2022-02-16 DIAGNOSIS — L570 Actinic keratosis: Secondary | ICD-10-CM | POA: Diagnosis not present

## 2022-02-16 DIAGNOSIS — I788 Other diseases of capillaries: Secondary | ICD-10-CM | POA: Diagnosis not present

## 2022-02-16 DIAGNOSIS — D235 Other benign neoplasm of skin of trunk: Secondary | ICD-10-CM | POA: Diagnosis not present

## 2022-02-16 DIAGNOSIS — L578 Other skin changes due to chronic exposure to nonionizing radiation: Secondary | ICD-10-CM | POA: Diagnosis not present

## 2022-02-16 DIAGNOSIS — L738 Other specified follicular disorders: Secondary | ICD-10-CM | POA: Diagnosis not present

## 2022-02-16 DIAGNOSIS — I8393 Asymptomatic varicose veins of bilateral lower extremities: Secondary | ICD-10-CM | POA: Diagnosis not present

## 2022-02-16 DIAGNOSIS — L853 Xerosis cutis: Secondary | ICD-10-CM | POA: Diagnosis not present
